# Patient Record
Sex: Male | Born: 1970 | Race: Black or African American | Hispanic: No | Smoking: Current every day smoker
Health system: Southern US, Community
[De-identification: ages and names within clinical notes are randomized; demographics above are authoritative.]

## PROBLEM LIST (undated history)

## (undated) DIAGNOSIS — IMO0001 Reserved for inherently not codable concepts without codable children: Secondary | ICD-10-CM

## (undated) HISTORY — PX: MOUTH SURGERY: SHX715

## (undated) HISTORY — PX: OTHER SURGICAL HISTORY: SHX169

---

## 2012-03-29 ENCOUNTER — Encounter (HOSPITAL_COMMUNITY): Payer: Self-pay | Admitting: Emergency Medicine

## 2012-03-29 ENCOUNTER — Emergency Department (HOSPITAL_COMMUNITY)
Admission: EM | Admit: 2012-03-29 | Discharge: 2012-03-30 | Disposition: A | Discharge status: Home / Self Care | Payer: Self-pay | Attending: Emergency Medicine | Admitting: Emergency Medicine

## 2012-03-29 DIAGNOSIS — F17200 Nicotine dependence, unspecified, uncomplicated: Secondary | ICD-10-CM | POA: Insufficient documentation

## 2012-03-29 DIAGNOSIS — IMO0001 Reserved for inherently not codable concepts without codable children: Secondary | ICD-10-CM | POA: Insufficient documentation

## 2012-03-29 DIAGNOSIS — L089 Local infection of the skin and subcutaneous tissue, unspecified: Secondary | ICD-10-CM

## 2012-03-29 DIAGNOSIS — L723 Sebaceous cyst: Secondary | ICD-10-CM | POA: Insufficient documentation

## 2012-03-29 HISTORY — DX: Reserved for inherently not codable concepts without codable children: IMO0001

## 2012-03-29 NOTE — ED Notes (Signed)
Patient complaining of boil on left side of cheek that started two days ago.  Denies drainage; reports discomfort upon palpation.

## 2012-03-30 MED ORDER — SULFAMETHOXAZOLE-TRIMETHOPRIM 800-160 MG PO TABS: 1.0000 | ORAL_TABLET | Freq: Two times a day (BID) | ORAL | Status: DC

## 2012-03-30 MED ORDER — HYDROCODONE-ACETAMINOPHEN 5-325 MG PO TABS
1.0000 | ORAL_TABLET | Freq: Once | ORAL | Status: AC
  Administered 2012-03-30: 1 via ORAL
  Filled 2012-03-30: qty 1

## 2012-03-30 MED ORDER — HYDROCODONE-ACETAMINOPHEN 5-325 MG PO TABS: 1.0000 | ORAL_TABLET | ORAL | Status: DC | PRN

## 2012-03-30 NOTE — ED Notes (Signed)
Swollen area lt temple.red and angry appearing

## 2012-03-30 NOTE — ED Provider Notes (Signed)
Medical screening examination/treatment/procedure(s) were performed by non-physician practitioner and as supervising physician I was immediately available for consultation/collaboration.    Vida Roller, MD 03/30/12 [phone]

## 2012-03-30 NOTE — Discharge Instructions (Signed)

## 2012-03-30 NOTE — ED Provider Notes (Signed)
History     CSN: [id number]  Arrival date & time 03/29/12  2258   First MD Initiated Contact with Patient 03/30/12 0038      Chief Complaint  Patient presents with  . Abscess    (Consider location/radiation/quality/duration/timing/severity/associated sxs/prior treatment) HPI Comments: Patient here with 2 day history of abscess to left side of face  - states that it has gradually increased in size - states no drainage from the area - denies fever chills, history of similar abscesses.  Patient is a 41 y.o. male presenting with abscess. The history is provided by the patient. No language interpreter was used.  Abscess  This is a new problem. The current episode started less than one week ago. The onset was gradual. The problem occurs rarely. The problem has been unchanged. The abscess is present on the face (left cheek). The problem is severe. The abscess is characterized by redness, painfulness and swelling. The patient was exposed to OTC medications. The abscess first occurred at home. Pertinent negatives include no anorexia, no decrease in physical activity, not sleeping less, not drinking less, no fever, no fussiness, not sleeping more, no diarrhea, no vomiting, no congestion, no rhinorrhea, no sore throat, no decreased responsiveness and no cough. His past medical history does not include skin abscesses in family. There were no sick contacts. He has received no recent medical care.    Past Medical History  Diagnosis Date  . No significant past medical history     Past Surgical History  Procedure Date  . Mouth surgery     History reviewed. No pertinent family history.  History  Substance Use Topics  . Smoking status: Current Everyday Smoker -- 1.0 packs/day  . Smokeless tobacco: Not on file  . Alcohol Use: Yes     Occassional Use      Review of Systems  Constitutional: Negative for fever, chills and decreased responsiveness.  HENT: Negative for congestion, sore throat  and rhinorrhea.   Eyes: Negative for pain.  Respiratory: Negative for cough.   Gastrointestinal: Negative for vomiting, diarrhea and anorexia.  Genitourinary: Negative for flank pain.  Musculoskeletal: Negative for back pain.  Skin: Positive for wound.    Allergies  Review of patient's allergies indicates no known allergies.  Home Medications   Current Outpatient Rx  Name Route Sig Dispense Refill  . HYDROCODONE-ACETAMINOPHEN 5-325 MG PO TABS Oral Take 1 tablet by mouth every 4 (four) hours as needed for pain. 30 tablet 0  . SULFAMETHOXAZOLE-TRIMETHOPRIM 800-160 MG PO TABS Oral Take 1 tablet by mouth 2 (two) times daily. 14 tablet 0    BP 135/81  Pulse 93  Temp(Src) 98.3 F (36.8 C) (Oral)  Resp 18  SpO2 97%  Physical Exam  Nursing note and vitals reviewed. Constitutional: He is oriented to person, place, and time. He appears well-developed and well-nourished. No distress.  HENT:  Head: Atraumatic.    Right Ear: External ear normal.  Left Ear: External ear normal.  Nose: Nose normal.  Mouth/Throat: Oropharynx is clear and moist. No oropharyngeal exudate.       2 cm area of fluctuance and swelling just preauricular, some firmness of the area also noted - mild erythema  Eyes: Conjunctivae are normal. Pupils are equal, round, and reactive to light. No scleral icterus.  Neck: Normal range of motion. Neck supple.  Cardiovascular: Normal rate, regular rhythm and normal heart sounds.  Exam reveals no gallop and no friction rub.   No murmur heard. Pulmonary/Chest: Effort normal  and breath sounds normal. No respiratory distress. He has no wheezes. He has no rales. He exhibits no tenderness.  Abdominal: Soft. Bowel sounds are normal. He exhibits no distension. There is no tenderness.  Musculoskeletal: Normal range of motion. He exhibits no edema and no tenderness.  Lymphadenopathy:    He has no cervical adenopathy.  Neurological: He is alert and oriented to person, place, and  time. No cranial nerve deficit.  Skin: Skin is warm and dry. No rash noted. There is erythema. No pallor.  Psychiatric: He has a normal mood and affect. His behavior is normal. Judgment and thought content normal.    ED Course  Procedures (including critical care time)  Labs Reviewed - No data to display No results found.  INCISION AND DRAINAGE Performed by: Cherrie Distance C. Consent: Verbal consent obtained. Risks and benefits: risks, benefits and alternatives were discussed Type: abscess  Body area: left face  Anesthesia: local infiltration  Local anesthetic: lidocaine 1% with epinephrine  Anesthetic total: 3 ml  Complexity: complex Blunt dissection to break up loculations  Drainage: purulent  Drainage amount: large  Packing material: 1/4 in iodoform gauze  Patient tolerance: Patient tolerated the procedure well with no immediate complications.   1. Infected sebaceous cyst       MDM  Patient with infected sebaceous cyst, able to express the contents of the capsule but unable to completely remove the capsule due to it's anatomic location close to the facial nerve and vascular structures of the face, I have packed the wound and will have the patient return here for follow up in 2 days but likely he will need to be referred further to ENT for this.          Izola Price [place], [place] 03/30/12 [phone]

## 2012-04-01 ENCOUNTER — Emergency Department (HOSPITAL_COMMUNITY)
Admission: EM | Admit: 2012-04-01 | Discharge: 2012-04-01 | Disposition: A | Discharge status: Home / Self Care | Payer: Self-pay | Attending: Emergency Medicine | Admitting: Emergency Medicine

## 2012-04-01 ENCOUNTER — Encounter (HOSPITAL_COMMUNITY): Payer: Self-pay

## 2012-04-01 DIAGNOSIS — L0291 Cutaneous abscess, unspecified: Secondary | ICD-10-CM

## 2012-04-01 DIAGNOSIS — L0201 Cutaneous abscess of face: Secondary | ICD-10-CM | POA: Insufficient documentation

## 2012-04-01 DIAGNOSIS — F17200 Nicotine dependence, unspecified, uncomplicated: Secondary | ICD-10-CM | POA: Insufficient documentation

## 2012-04-01 DIAGNOSIS — L03211 Cellulitis of face: Secondary | ICD-10-CM | POA: Insufficient documentation

## 2012-04-01 DIAGNOSIS — Z09 Encounter for follow-up examination after completed treatment for conditions other than malignant neoplasm: Secondary | ICD-10-CM | POA: Insufficient documentation

## 2012-04-01 NOTE — ED Provider Notes (Signed)
History   This chart was scribed for Benny Lennert, MD by Charolett Bumpers . The patient was seen in room STRE4/STRE4.    CSN: [id number]  Arrival date & time 04/01/12  1205   First MD Initiated Contact with Patient 04/01/12 1223      No chief complaint on file.   (Consider location/radiation/quality/duration/timing/severity/associated sxs/prior treatment) HPI Comments: Patient states that he is here for a re-evaluation of an abscess that was I&D'd 2 days ago, that is located on his left cheek. Patient reports associated drainage to the area and states the packing came out last night. Patient reports compliance with abx.   Patient is a 41 y.o. male presenting with wound check. The history is provided by the patient.  Wound Check  He was treated in the ED 2 to 3 days ago. Previous treatment in the ED includes I&D of abscess. Treatments since wound repair include oral antibiotics. There has been clear discharge from the wound. There is no redness present. There is no swelling present. The pain has improved.   Drained Tuesday morning Patient states that it was   Past Medical History  Diagnosis Date  . No significant past medical history     Past Surgical History  Procedure Date  . Mouth surgery     No family history on file.  History  Substance Use Topics  . Smoking status: Current Everyday Smoker -- 1.0 packs/day  . Smokeless tobacco: Not on file  . Alcohol Use: Yes     Occassional Use      Review of Systems  Constitutional: Negative for fever and chills.  Respiratory: Negative for shortness of breath.   Gastrointestinal: Negative for nausea and vomiting.  Skin: Positive for wound.  Neurological: Negative for weakness.  All other systems reviewed and are negative.    Allergies  Review of patient's allergies indicates no known allergies.  Home Medications   Current Outpatient Rx  Name Route Sig Dispense Refill  . HYDROCODONE-ACETAMINOPHEN 5-325 MG PO  TABS Oral Take 1 tablet by mouth every 4 (four) hours as needed for pain. 30 tablet 0  . SULFAMETHOXAZOLE-TRIMETHOPRIM 800-160 MG PO TABS Oral Take 1 tablet by mouth 2 (two) times daily. 14 tablet 0    BP 105/75  Pulse 78  Temp(Src) 98.4 F (36.9 C) (Oral)  Resp 16  Ht 5\' 11"  (1.803 m)  Wt 142 lb (64.411 kg)  BMI 19.81 kg/m2  SpO2 98%  Physical Exam  Constitutional: He is oriented to person, place, and time. He appears well-developed.  HENT:  Head: Normocephalic.  Eyes: Conjunctivae are normal.  Neck: No tracheal deviation present.  Cardiovascular:  No murmur heard. Musculoskeletal: Normal range of motion.  Neurological: He is oriented to person, place, and time.  Skin: Skin is warm.       Cyst to left cheek that has an incision from an abscess that is healing.   Psychiatric: He has a normal mood and affect.    ED Course  Procedures (including critical care time)  DIAGNOSTIC STUDIES: Oxygen Saturation is 98% on room air, normal by my interpretation.    COORDINATION OF CARE:  1228: Discussed planned course of treatment with the patient, who is agreeable at this time.     Labs Reviewed - No data to display No results found.   No diagnosis found.    MDM  The chart was scribed for me under my direct supervision.  I personally performed the history, physical, and medical decision  making and all procedures in the evaluation of this patient.Benny Lennert, MD 04/01/12 [phone]

## 2012-04-01 NOTE — ED Notes (Signed)
Pt presents for re-evaluation of abscess that was I&D'd on Tuesday morning.  Pt reports compliance with abx.  Pt reports drainage to area, reports packing came out last night.

## 2012-04-01 NOTE — ED Notes (Signed)
Cyst improved by 50% since previous visit this past Tuesday.

## 2012-04-01 NOTE — Discharge Instructions (Signed)
Follow up with dr. Suszanne Conners to have cyst removed

## 2014-01-05 ENCOUNTER — Ambulatory Visit (HOSPITAL_COMMUNITY)
Admission: RE | Admit: 2014-01-05 | Discharge: 2014-01-05 | Disposition: A | Discharge status: Home / Self Care | Payer: Disability Insurance | Source: Ambulatory Visit | Attending: Family Medicine | Admitting: Family Medicine

## 2014-01-05 ENCOUNTER — Other Ambulatory Visit (HOSPITAL_COMMUNITY): Payer: Self-pay | Admitting: Family Medicine

## 2014-01-05 DIAGNOSIS — M25559 Pain in unspecified hip: Secondary | ICD-10-CM | POA: Insufficient documentation

## 2014-01-05 DIAGNOSIS — M25552 Pain in left hip: Secondary | ICD-10-CM

## 2015-04-13 ENCOUNTER — Emergency Department (HOSPITAL_COMMUNITY)
Admission: EM | Admit: 2015-04-13 | Discharge: 2015-04-14 | Disposition: A | Discharge status: Home / Self Care | Payer: Medicare Other | Attending: Emergency Medicine | Admitting: Emergency Medicine

## 2015-04-13 ENCOUNTER — Encounter (HOSPITAL_COMMUNITY): Payer: Self-pay | Admitting: Emergency Medicine

## 2015-04-13 DIAGNOSIS — Z720 Tobacco use: Secondary | ICD-10-CM | POA: Insufficient documentation

## 2015-04-13 DIAGNOSIS — R1032 Left lower quadrant pain: Secondary | ICD-10-CM | POA: Insufficient documentation

## 2015-04-13 DIAGNOSIS — R630 Anorexia: Secondary | ICD-10-CM | POA: Insufficient documentation

## 2015-04-13 DIAGNOSIS — R109 Unspecified abdominal pain: Secondary | ICD-10-CM

## 2015-04-13 DIAGNOSIS — M545 Low back pain: Secondary | ICD-10-CM | POA: Insufficient documentation

## 2015-04-13 LAB — CBC WITH DIFFERENTIAL/PLATELET
BASOS ABS: 0 10*3/uL (ref 0.0–0.1)
BASOS PCT: 0 % (ref 0–1)
EOS PCT: 2 % (ref 0–5)
Eosinophils Absolute: 0.2 10*3/uL (ref 0.0–0.7)
HEMATOCRIT: 42.1 % (ref 39.0–52.0)
HEMOGLOBIN: 14.2 g/dL (ref 13.0–17.0)
Lymphocytes Relative: 39 % (ref 12–46)
Lymphs Abs: 3.8 10*3/uL (ref 0.7–4.0)
MCH: 32.6 pg (ref 26.0–34.0)
MCHC: 33.7 g/dL (ref 30.0–36.0)
MCV: 96.8 fL (ref 78.0–100.0)
MONO ABS: 0.9 10*3/uL (ref 0.1–1.0)
MONOS PCT: 9 % (ref 3–12)
Neutro Abs: 4.8 10*3/uL (ref 1.7–7.7)
Neutrophils Relative %: 50 % (ref 43–77)
Platelets: 265 10*3/uL (ref 150–400)
RBC: 4.35 MIL/uL (ref 4.22–5.81)
RDW: 12 % (ref 11.5–15.5)
WBC: 9.7 10*3/uL (ref 4.0–10.5)

## 2015-04-13 LAB — COMPREHENSIVE METABOLIC PANEL
ALT: 14 U/L — ABNORMAL LOW (ref 17–63)
AST: 26 U/L (ref 15–41)
Albumin: 3.6 g/dL (ref 3.5–5.0)
Alkaline Phosphatase: 63 U/L (ref 38–126)
Anion gap: 10 (ref 5–15)
BUN: <5 mg/dL — ABNORMAL LOW (ref 6–20)
CALCIUM: 8.7 mg/dL — AB (ref 8.9–10.3)
CO2: 20 mmol/L — AB (ref 22–32)
CREATININE: 1.03 mg/dL (ref 0.61–1.24)
Chloride: 107 mmol/L (ref 101–111)
GLUCOSE: 72 mg/dL (ref 65–99)
Potassium: 4 mmol/L (ref 3.5–5.1)
Sodium: 137 mmol/L (ref 135–145)
Total Bilirubin: 0.4 mg/dL (ref 0.3–1.2)
Total Protein: 7.4 g/dL (ref 6.5–8.1)

## 2015-04-13 LAB — URINALYSIS, ROUTINE W REFLEX MICROSCOPIC
Bilirubin Urine: NEGATIVE
Glucose, UA: NEGATIVE mg/dL
Hgb urine dipstick: NEGATIVE
KETONES UR: NEGATIVE mg/dL
LEUKOCYTES UA: NEGATIVE
NITRITE: NEGATIVE
PH: 5 (ref 5.0–8.0)
PROTEIN: NEGATIVE mg/dL
Specific Gravity, Urine: 1.004 — ABNORMAL LOW (ref 1.005–1.030)
UROBILINOGEN UA: 0.2 mg/dL (ref 0.0–1.0)

## 2015-04-13 NOTE — ED Notes (Signed)
Pt. woke up this morning with right lower back pain and dysuria , denies hematuria , no fever or chills.

## 2015-04-14 ENCOUNTER — Emergency Department (HOSPITAL_COMMUNITY): Payer: Medicare Other

## 2015-04-14 MED ORDER — MORPHINE SULFATE 4 MG/ML IJ SOLN
6.0000 mg | Freq: Once | INTRAMUSCULAR | Status: AC
  Administered 2015-04-14: 6 mg via INTRAVENOUS
  Filled 2015-04-14: qty 2

## 2015-04-14 MED ORDER — NAPROXEN 500 MG PO TABS: 500.0000 mg | ORAL_TABLET | Freq: Two times a day (BID) | ORAL | Status: AC

## 2015-04-14 MED ORDER — NAPROXEN 250 MG PO TABS
500.0000 mg | ORAL_TABLET | Freq: Once | ORAL | Status: AC
  Administered 2015-04-14: 500 mg via ORAL
  Filled 2015-04-14: qty 2

## 2015-04-14 MED ORDER — CYCLOBENZAPRINE HCL 10 MG PO TABS: 10.0000 mg | ORAL_TABLET | Freq: Two times a day (BID) | ORAL | Status: AC | PRN

## 2015-04-14 MED ORDER — KETOROLAC TROMETHAMINE 15 MG/ML IJ SOLN
15.0000 mg | Freq: Once | INTRAMUSCULAR | Status: AC
  Administered 2015-04-14: 15 mg via INTRAVENOUS
  Filled 2015-04-14: qty 1

## 2015-04-14 MED ORDER — CYCLOBENZAPRINE HCL 10 MG PO TABS
5.0000 mg | ORAL_TABLET | Freq: Once | ORAL | Status: AC
  Administered 2015-04-14: 5 mg via ORAL
  Filled 2015-04-14: qty 1

## 2015-04-14 NOTE — Discharge Instructions (Signed)
If you were given medicines take as directed.  If you are on coumadin or contraceptives realize their levels and effectiveness is altered by many different medicines.  If you have any reaction (rash, tongues swelling, other) to the medicines stop taking and see a physician.    If your blood pressure was elevated in the ER make sure you follow up for management with a primary doctor or return for chest pain, shortness of breath or stroke symptoms.  Please follow up as directed and return to the ER or see a physician for new or worsening symptoms.  Thank you. Filed Vitals:   04/13/15 2313 04/13/15 2314 04/14/15 0015 04/14/15 0045  BP: 129/84  122/96 115/87  Pulse:  90 41 68  Temp:      TempSrc:      Resp:   20 11  SpO2:  94% 94%
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
## 2015-04-14 NOTE — ED Notes (Signed)
Pt states he was told by Dr. Jodi Mourning that he would get a work note for 2 days but note has him going back to work on Sunday.  Spoke with [place], PA and work note given to pt to return to work on Monday.

## 2018-06-14 ENCOUNTER — Encounter: Payer: Self-pay | Admitting: Emergency Medicine

## 2018-06-14 ENCOUNTER — Emergency Department
Admission: EM | Admit: 2018-06-14 | Discharge: 2018-06-14 | Discharge status: Against Medical Advice | Payer: Self-pay | Attending: Emergency Medicine | Admitting: Emergency Medicine

## 2018-06-14 DIAGNOSIS — F32A Depression, unspecified: Secondary | ICD-10-CM

## 2018-06-14 DIAGNOSIS — Z5321 Procedure and treatment not carried out due to patient leaving prior to being seen by health care provider: Secondary | ICD-10-CM | POA: Insufficient documentation

## 2018-06-14 DIAGNOSIS — F17210 Nicotine dependence, cigarettes, uncomplicated: Secondary | ICD-10-CM | POA: Insufficient documentation

## 2018-06-14 DIAGNOSIS — F329 Major depressive disorder, single episode, unspecified: Secondary | ICD-10-CM | POA: Insufficient documentation

## 2018-06-14 DIAGNOSIS — R45851 Suicidal ideations: Secondary | ICD-10-CM | POA: Insufficient documentation

## 2018-06-14 NOTE — ED Triage Notes (Signed)
Arrives via EMS for c/o SI,

## 2018-06-14 NOTE — ED Notes (Signed)
While attempting to dress the patient out he expressed that he just wanted to leave and not be seen. I spoke to Dr. Cyril LoosenKinner and advised him of same. He said he was not going to place the patient under an IVC and he cold leave if he wanted to. I advised the triage and first nurse of same.
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
## 2018-06-14 NOTE — ED Notes (Signed)
Went in to rm to draw blood from pt and pt kept stating "I don't want to stay, I want to leave." Pt was talking with this tech about abscess on left side of his face and saying "I know if I stay here and talk to someone that yall are going to keep me." Made pt aware that Keith,EDT went to talk to MD and he would return. Pt calm while talking.
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
## 2021-02-03 ENCOUNTER — Other Ambulatory Visit: Payer: Self-pay

## 2021-02-03 ENCOUNTER — Emergency Department
Admission: EM | Admit: 2021-02-03 | Discharge: 2021-02-04 | Disposition: A | Discharge status: Home / Self Care | Payer: Self-pay | Attending: Emergency Medicine | Admitting: Emergency Medicine

## 2021-02-03 ENCOUNTER — Emergency Department: Payer: Self-pay

## 2021-02-03 DIAGNOSIS — R202 Paresthesia of skin: Secondary | ICD-10-CM | POA: Insufficient documentation

## 2021-02-03 DIAGNOSIS — F17200 Nicotine dependence, unspecified, uncomplicated: Secondary | ICD-10-CM | POA: Insufficient documentation

## 2021-02-03 DIAGNOSIS — Z79899 Other long term (current) drug therapy: Secondary | ICD-10-CM | POA: Insufficient documentation

## 2021-02-03 DIAGNOSIS — I10 Essential (primary) hypertension: Secondary | ICD-10-CM | POA: Insufficient documentation

## 2021-02-03 NOTE — ED Triage Notes (Signed)
Pt arrives via EMS from work. EMS called earlier around 2130 for pt feeling "shakey" - blood sugar was 72, he ate some food and refused transport to hospital. Called back this time for patient "just still not feeling right." Pt states he just feels off and lightheaded with change in position. Found to be hypertensive. No known medical history or meds but pt admits he does not follow with a doctor regularly. Denies HA, CP, abd pain or n/v/d.
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
## 2021-02-04 ENCOUNTER — Emergency Department: Payer: Self-pay

## 2021-02-04 LAB — URINALYSIS, COMPLETE (UACMP) WITH MICROSCOPIC
Bacteria, UA: NONE SEEN
Bilirubin Urine: NEGATIVE
Glucose, UA: NEGATIVE mg/dL
Ketones, ur: NEGATIVE mg/dL
Leukocytes,Ua: NEGATIVE
Nitrite: NEGATIVE
Protein, ur: NEGATIVE mg/dL
Specific Gravity, Urine: 1.002 — ABNORMAL LOW (ref 1.005–1.030)
pH: 6 (ref 5.0–8.0)

## 2021-02-04 LAB — COMPREHENSIVE METABOLIC PANEL
ALT: 10 U/L (ref 0–44)
AST: 24 U/L (ref 15–41)
Albumin: 3.8 g/dL (ref 3.5–5.0)
Alkaline Phosphatase: 57 U/L (ref 38–126)
Anion gap: 8 (ref 5–15)
BUN: 8 mg/dL (ref 6–20)
CO2: 25 mmol/L (ref 22–32)
Calcium: 8.4 mg/dL — ABNORMAL LOW (ref 8.9–10.3)
Chloride: 104 mmol/L (ref 98–111)
Creatinine, Ser: 0.85 mg/dL (ref 0.61–1.24)
GFR, Estimated: >60 mL/min (ref >60)
Glucose, Bld: 106 mg/dL — ABNORMAL HIGH (ref 70–99)
Potassium: 3.7 mmol/L (ref 3.5–5.1)
Sodium: 137 mmol/L (ref 135–145)
Total Bilirubin: 0.6 mg/dL (ref 0.3–1.2)
Total Protein: 7.7 g/dL (ref 6.5–8.1)

## 2021-02-04 LAB — CBC WITH DIFFERENTIAL/PLATELET
Abs Immature Granulocytes: 0.04 10*3/uL (ref 0.00–0.07)
Basophils Absolute: 0 10*3/uL (ref 0.0–0.1)
Basophils Relative: 0 %
Eosinophils Absolute: 0.1 10*3/uL (ref 0.0–0.5)
Eosinophils Relative: 1 %
HCT: 38.5 % — ABNORMAL LOW (ref 39.0–52.0)
Hemoglobin: 12.6 g/dL — ABNORMAL LOW (ref 13.0–17.0)
Immature Granulocytes: 1 %
Lymphocytes Relative: 13 %
Lymphs Abs: 1.1 10*3/uL (ref 0.7–4.0)
MCH: 31.7 pg (ref 26.0–34.0)
MCHC: 32.7 g/dL (ref 30.0–36.0)
MCV: 97 fL (ref 80.0–100.0)
Monocytes Absolute: 0.7 10*3/uL (ref 0.1–1.0)
Monocytes Relative: 9 %
Neutro Abs: 6.2 10*3/uL (ref 1.7–7.7)
Neutrophils Relative %: 76 %
Platelets: 203 10*3/uL (ref 150–400)
RBC: 3.97 MIL/uL — ABNORMAL LOW (ref 4.22–5.81)
RDW: 11.9 % (ref 11.5–15.5)
WBC: 8.1 10*3/uL (ref 4.0–10.5)
nRBC: 0.4 % — ABNORMAL HIGH (ref 0.0–0.2)

## 2021-02-04 LAB — URINE DRUG SCREEN, QUALITATIVE (ARMC ONLY)
Amphetamines, Ur Screen: NOT DETECTED
Barbiturates, Ur Screen: NOT DETECTED
Benzodiazepine, Ur Scrn: NOT DETECTED
Cannabinoid 50 Ng, Ur ~~LOC~~: POSITIVE — AB
Cocaine Metabolite,Ur ~~LOC~~: NOT DETECTED
MDMA (Ecstasy)Ur Screen: NOT DETECTED
Methadone Scn, Ur: NOT DETECTED
Opiate, Ur Screen: NOT DETECTED
Phencyclidine (PCP) Ur S: NOT DETECTED
Tricyclic, Ur Screen: NOT DETECTED

## 2021-02-04 LAB — TROPONIN I (HIGH SENSITIVITY)
Troponin I (High Sensitivity): 4 ng/L (ref <18)
Troponin I (High Sensitivity): 3 ng/L (ref <18)

## 2021-02-04 MED ORDER — AMLODIPINE BESYLATE 5 MG PO TABS: 5.0000 mg | ORAL_TABLET | Freq: Every day | ORAL | 0 refills | Status: DC

## 2021-02-04 MED ORDER — CLONIDINE HCL 0.1 MG PO TABS
0.1000 mg | ORAL_TABLET | Freq: Once | ORAL | Status: AC
  Administered 2021-02-04: 0.1 mg via ORAL
  Filled 2021-02-04: qty 1

## 2021-02-04 NOTE — Discharge Instructions (Signed)
1.  Start Amlodipine 5mg  daily for your blood pressure. 2.  Keep a log of your blood pressures morning, noon and night.  Take these to your doctor when you follow-up. 3.  Return to the ER for worsening symptoms, persistent vomiting, difficulty breathing or other concerns.

## 2021-02-04 NOTE — ED Notes (Signed)
Diastolic >100 - will administer clonidine per doctor.

## 2021-02-04 NOTE — ED Notes (Signed)
BP Improved, discussed with Doctor and will hold dose of ordered BP med and continue to monitor patient closely. Pt admits to feeling improved and lightheadedness resolved. Resting comfortably in bed. No requests at this time

## 2021-12-02 IMAGING — CT CT HEAD W/O CM
3 series · 16 of 47 positions shown, 19 images · non-contrast
Comparison: None.

CLINICAL DATA: Dizziness

EXAM:
CT HEAD WITHOUT CONTRAST
TECHNIQUE: Contiguous axial images were obtained from the base of the skull
through the vertex without intravenous contrast.

[Series 2: head wo · axial · 0.42mm/px · z∈[+350,+485]mm · 10 of 33 slices shown, 13 images]
[im 3/33  brain]
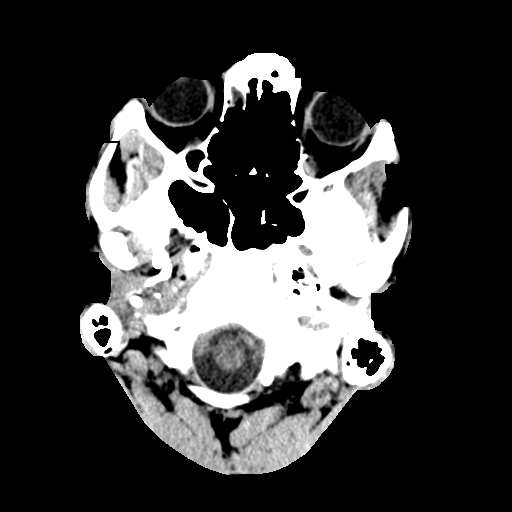
[im 3/33  bone]
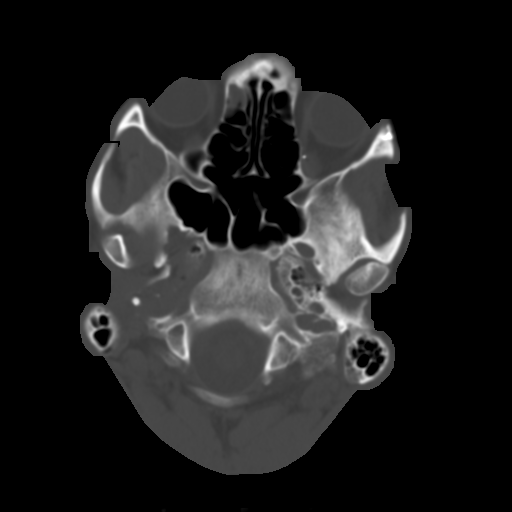
[im 6/33  brain]
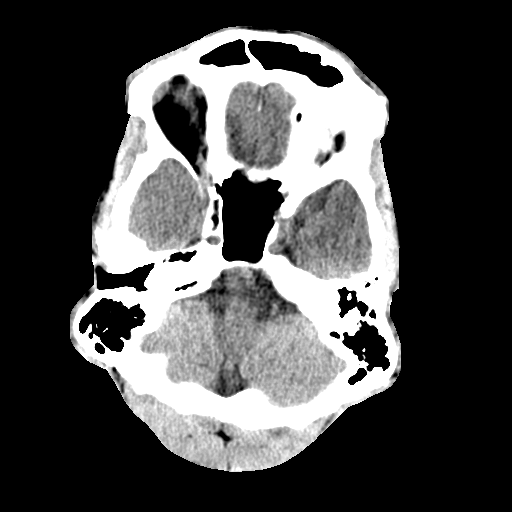
[im 9/33  brain]
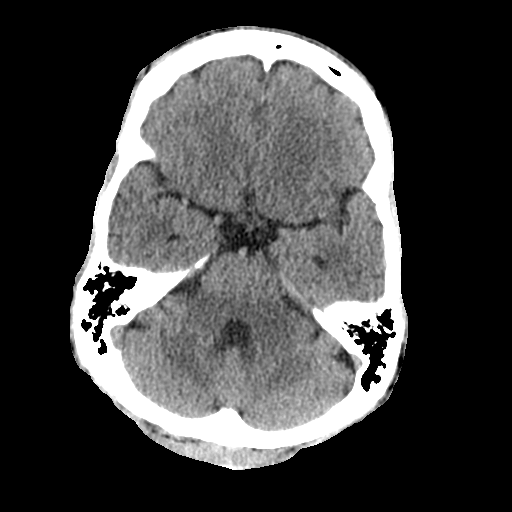
[im 12/33  brain]
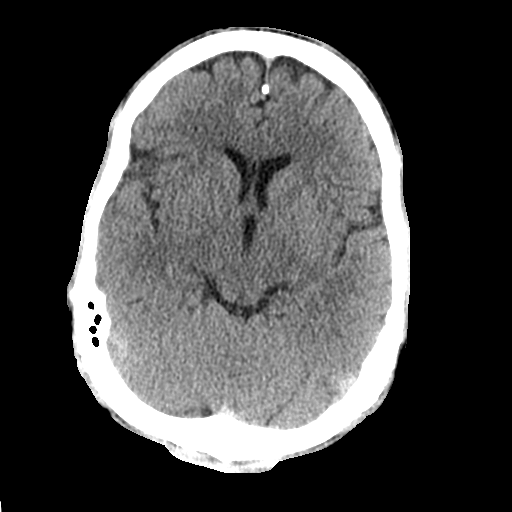
[im 15/33  brain]
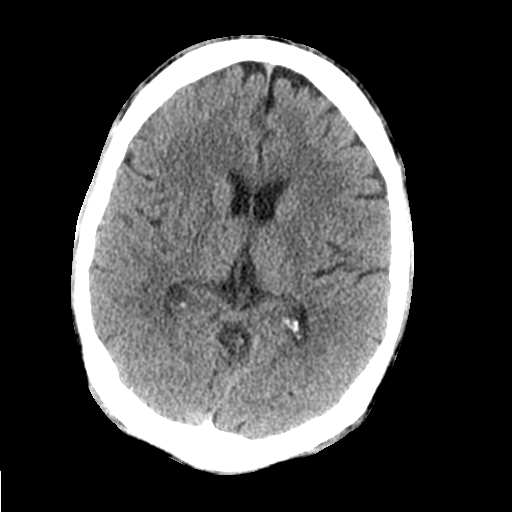
[im 15/33  bone]
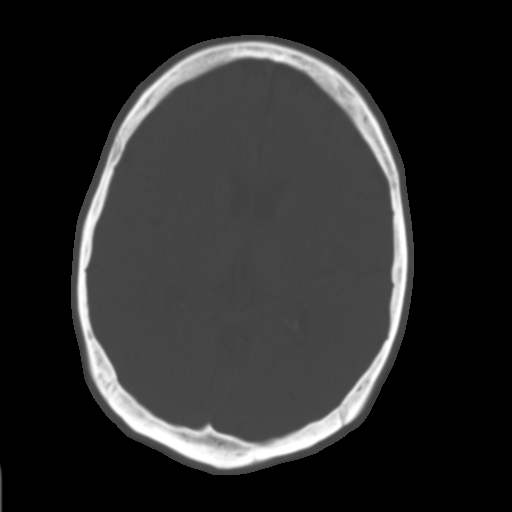
[im 18/33  brain]
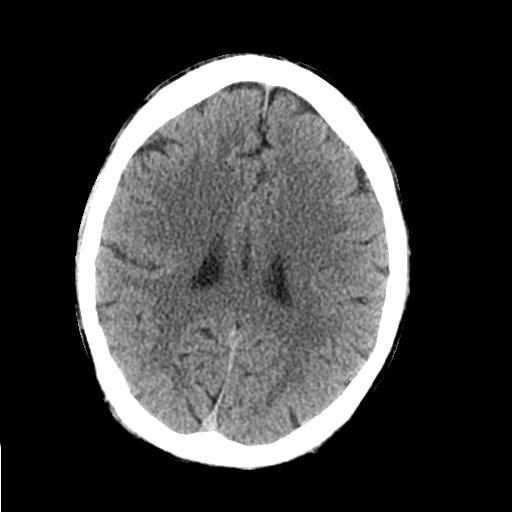
[im 21/33  brain]
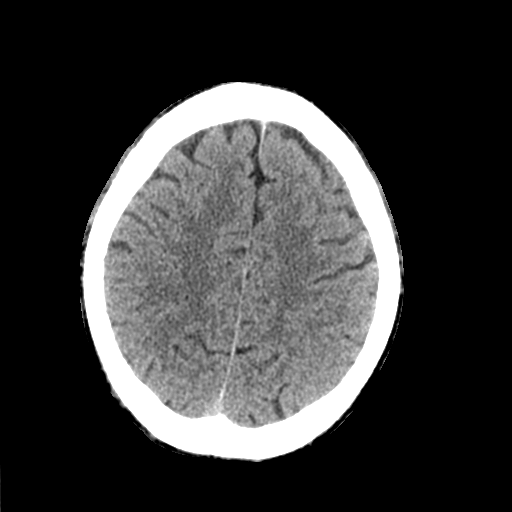
[im 25/33  brain]
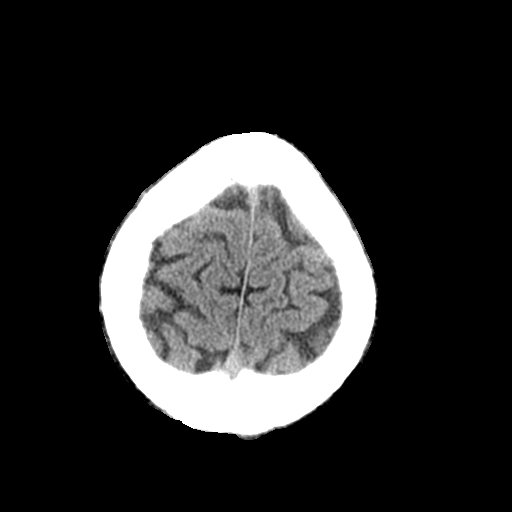
[im 27/33  brain]
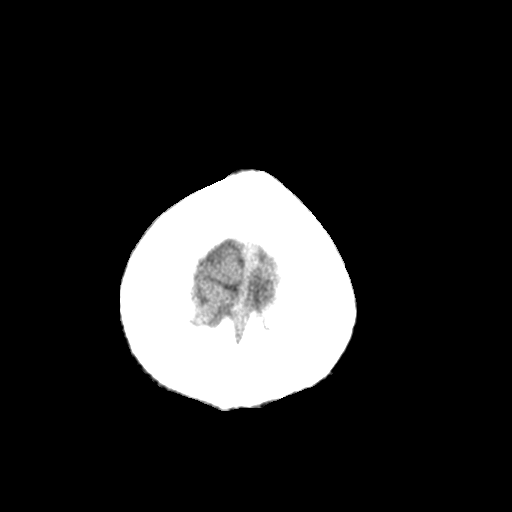
[im 27/33  bone]
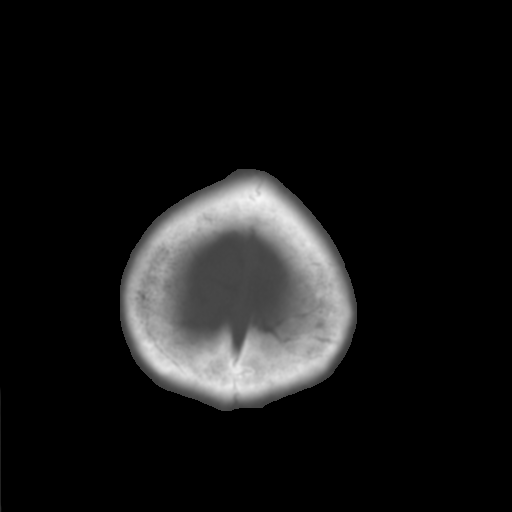
[im 30/33  brain]
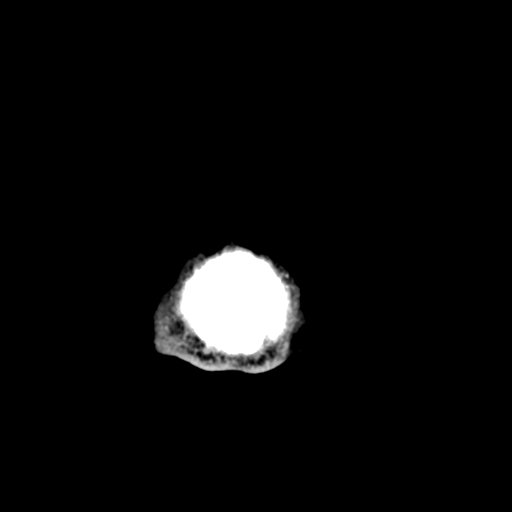

[Series 4: coronal soft tissue · coronal · 0.34mm/px · 3 of 71 slices shown]
[im 24/71  brain]
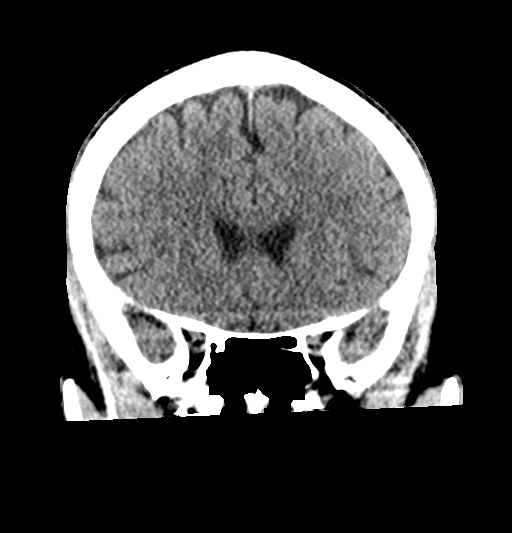
[im 32/71  brain]
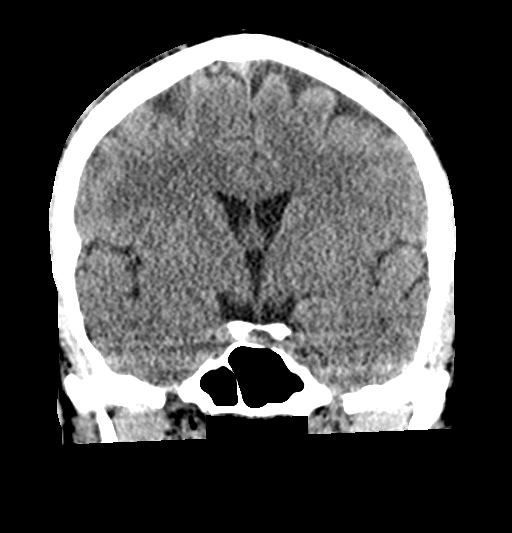
[im 39/71  brain]
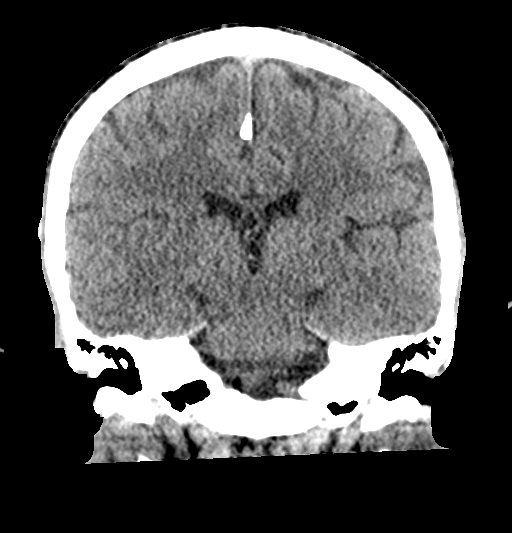

[Series 5: sagittal soft tissue · sagittal · 0.35mm/px · 3 of 58 slices shown]
[im 20/58  brain]
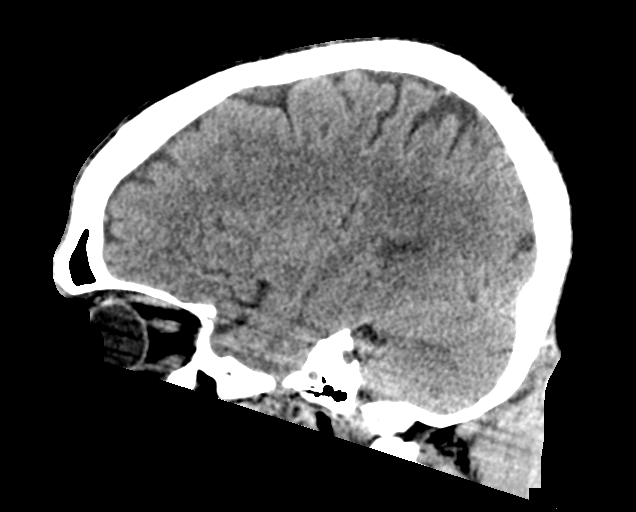
[im 29/58  brain]
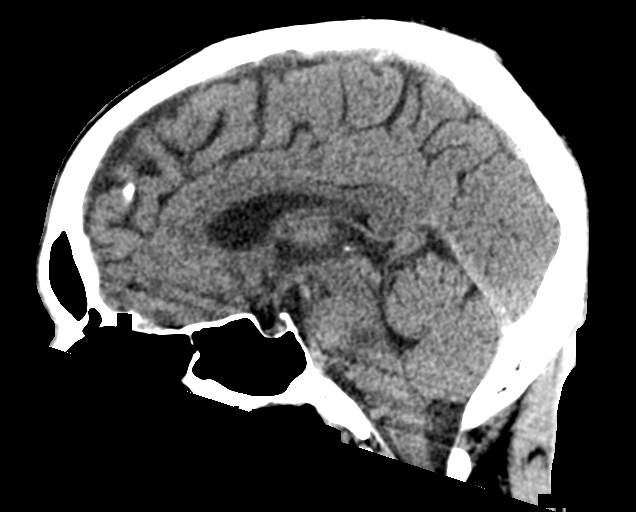
[im 39/58  brain]
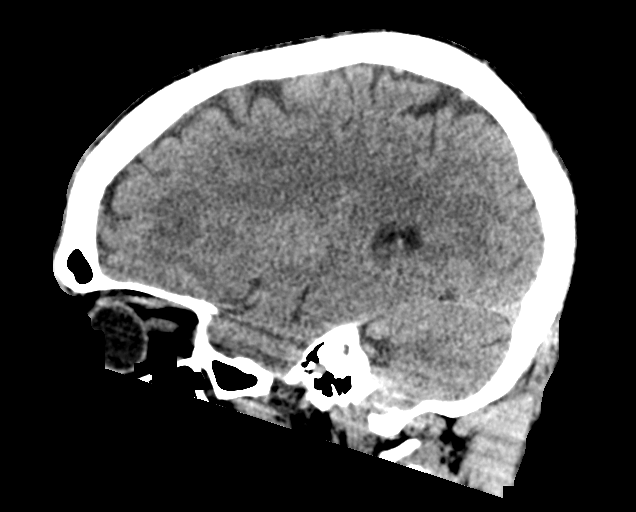

[16 of 47 positions shown; findings below may reference images not displayed]

FINDINGS: Brain: No acute territorial infarction, hemorrhage or intracranial
mass. The ventricles are of normal size.

Vascular: No hyperdense vessels.  No unexpected calcification

Skull: Normal. Negative for fracture or focal lesion.

Sinuses/Orbits: Surgically fixated left superolateral orbit
fracture. Punctate metallic fragments within the orbit. Incompletely
visualized probable hardware at the floor of left orbit. Patchy
mucosal thickening in the ethmoid and right frontal sinus

Other: None
IMPRESSION: 1. No CT evidence for acute intracranial abnormality.
2. Old fracture deformities of left orbit, incompletely visualized
with postsurgical changes

## 2023-10-31 ENCOUNTER — Emergency Department (HOSPITAL_COMMUNITY)
Admission: EM | Admit: 2023-10-31 | Discharge: 2023-10-31 | Disposition: A | Discharge status: Home / Self Care | Payer: Commercial Managed Care - PPO | Attending: Emergency Medicine | Admitting: Emergency Medicine

## 2023-10-31 ENCOUNTER — Encounter (HOSPITAL_COMMUNITY): Payer: Self-pay | Admitting: *Deleted

## 2023-10-31 ENCOUNTER — Other Ambulatory Visit: Payer: Self-pay

## 2023-10-31 DIAGNOSIS — R319 Hematuria, unspecified: Secondary | ICD-10-CM | POA: Diagnosis present

## 2023-10-31 LAB — CBC WITH DIFFERENTIAL/PLATELET
Abs Immature Granulocytes: 0.01 10*3/uL (ref 0.00–0.07)
Basophils Absolute: 0 10*3/uL (ref 0.0–0.1)
Basophils Relative: 1 %
Eosinophils Absolute: 0.1 10*3/uL (ref 0.0–0.5)
Eosinophils Relative: 2 %
HCT: 41.1 % (ref 39.0–52.0)
Hemoglobin: 13.6 g/dL (ref 13.0–17.0)
Immature Granulocytes: 0 %
Lymphocytes Relative: 52 %
Lymphs Abs: 2.8 10*3/uL (ref 0.7–4.0)
MCH: 32.2 pg (ref 26.0–34.0)
MCHC: 33.1 g/dL (ref 30.0–36.0)
MCV: 97.4 fL (ref 80.0–100.0)
Monocytes Absolute: 0.4 10*3/uL (ref 0.1–1.0)
Monocytes Relative: 8 %
Neutro Abs: 2 10*3/uL (ref 1.7–7.7)
Neutrophils Relative %: 37 %
Platelets: 265 10*3/uL (ref 150–400)
RBC: 4.22 MIL/uL (ref 4.22–5.81)
RDW: 12.5 % (ref 11.5–15.5)
WBC: 5.4 10*3/uL (ref 4.0–10.5)
nRBC: 0 % (ref 0.0–0.2)

## 2023-10-31 LAB — BASIC METABOLIC PANEL
Anion gap: 10 (ref 5–15)
BUN: 6 mg/dL (ref 6–20)
CO2: 23 mmol/L (ref 22–32)
Calcium: 8.6 mg/dL — ABNORMAL LOW (ref 8.9–10.3)
Chloride: 105 mmol/L (ref 98–111)
Creatinine, Ser: 0.88 mg/dL (ref 0.61–1.24)
GFR, Estimated: >60 mL/min (ref >60)
Glucose, Bld: 110 mg/dL — ABNORMAL HIGH (ref 70–99)
Potassium: 3.8 mmol/L (ref 3.5–5.1)
Sodium: 138 mmol/L (ref 135–145)

## 2023-10-31 LAB — URINALYSIS, ROUTINE W REFLEX MICROSCOPIC
Bacteria, UA: NONE SEEN
Bilirubin Urine: NEGATIVE
Glucose, UA: NEGATIVE mg/dL
Ketones, ur: NEGATIVE mg/dL
Leukocytes,Ua: NEGATIVE
Nitrite: NEGATIVE
Protein, ur: NEGATIVE mg/dL
Specific Gravity, Urine: 1.002 — ABNORMAL LOW (ref 1.005–1.030)
pH: 5 (ref 5.0–8.0)

## 2023-10-31 NOTE — ED Triage Notes (Signed)
 Pt with blood urine for past 3 days. Denies hx kidney stones, denies any burning with urination.

## 2023-10-31 NOTE — ED Provider Notes (Signed)
 [place] EMERGENCY DEPARTMENT AT [facility] Provider Note   CSN: [id number] Arrival date & time: 10/31/23  1737     History  Chief Complaint  Patient presents with   Hematuria    Mackay Hanauer is a 53 y.o. male.  He has no significant PMH.  He presents to the ER with his wife today for evaluation of blood in the urine, has been intermittent x 3 days, he states has had this happen in the past however today after having a bowel movement he urinated and stated it looked like straight blood.  He states it was an amount that was worrisome to him and made him decide to come to the ER, no flank pain or back pain, no nausea or vomiting or abdominal pain.  Reports family history of prostate cancer.  He denies dysuria or urethral discharge   Hematuria       Home Medications Prior to Admission medications   Medication Sig Start Date End Date Taking? Authorizing Provider  amLODipine  (NORVASC ) 5 MG tablet Take 1 tablet (5 mg total) by mouth daily. 02/04/21   Sung, Jade J, MD  cyclobenzaprine  (FLEXERIL ) 10 MG tablet Take 1 tablet (10 mg total) by mouth 2 (two) times daily as needed for muscle spasms. Patient not taking: Reported on 02/04/2021 04/14/15   Tonia Chew, MD  naproxen  (NAPROSYN ) 500 MG tablet Take 1 tablet (500 mg total) by mouth 2 (two) times daily. Patient not taking: Reported on 02/04/2021 04/14/15   Tonia Chew, MD      Allergies    Patient has no known allergies.    Review of Systems   Review of Systems  Genitourinary:  Positive for hematuria.    Physical Exam Updated Vital Signs BP (!) 144/116 (BP Location: Right Arm)   Pulse 85   Temp 97.6 F (36.4 C)   Resp 19   Ht 5' 11 (1.803 m)   Wt 68 kg   SpO2 97%   BMI 20.92 kg/m  Physical Exam Vitals and nursing note reviewed.  Constitutional:      General: He is not in acute distress.    Appearance: He is well-developed.  HENT:     Head: Normocephalic and atraumatic.     Mouth/Throat:      Mouth: Mucous membranes are moist.  Eyes:     Extraocular Movements: Extraocular movements intact.     Conjunctiva/sclera: Conjunctivae normal.     Pupils: Pupils are equal, round, and reactive to light.  Cardiovascular:     Rate and Rhythm: Normal rate and regular rhythm.     Heart sounds: No murmur heard. Pulmonary:     Effort: Pulmonary effort is normal. No respiratory distress.     Breath sounds: Normal breath sounds.  Abdominal:     Palpations: Abdomen is soft.     Tenderness: There is no abdominal tenderness. There is no right CVA tenderness.  Musculoskeletal:        General: No swelling.     Cervical back: Neck supple.  Skin:    General: Skin is warm and dry.     Capillary Refill: Capillary refill takes less than 2 seconds.  Neurological:     General: No focal deficit present.     Mental Status: He is alert and oriented to person, place, and time.  Psychiatric:        Mood and Affect: Mood normal.     ED Results / Procedures / Treatments   Labs (all labs ordered  are listed, but only abnormal results are displayed) Labs Reviewed  URINALYSIS, ROUTINE W REFLEX MICROSCOPIC - Abnormal; Notable for the following components:      Result Value   Color, Urine STRAW (*)    Specific Gravity, Urine 1.002 (*)    Hgb urine dipstick SMALL (*)    All other components within normal limits  BASIC METABOLIC PANEL - Abnormal; Notable for the following components:   Glucose, Bld 110 (*)    Calcium 8.6 (*)    All other components within normal limits  CBC WITH DIFFERENTIAL/PLATELET    EKG None  Radiology No results found.  Procedures Procedures    Medications Ordered in ED Medications - No data to display  ED Course/ Medical Decision Making/ A&P                                 Medical Decision Making This patient presents to the ED for concern of hematuria, this involves an extensive number of treatment options, and is a complaint that carries with it a high risk of  complications and morbidity.  The differential diagnosis includes UTI, ureterolithiasis, malignancy, other   Co morbidities that complicate the patient evaluation  Tobacco abuse, cannabis use   Additional history obtained:  Additional history obtained from EMR External records from outside source obtained and reviewed including prior notes and labs   Lab Tests:  I Ordered, and personally interpreted labs.  The pertinent results include: Urinalysis shows no hematuria, no UTI, CBC is normal, BMP with slightly low calcium but otherwise normal     Problem List / ED Course / Critical interventions / Medication management  Hematuria-patient not having hematuria consistently, no blood in his urine today but states he saw blood in his urine this morning.  He is a smoker, discussed that this can increase the risk for cancer screening specifically bladder cancer and urged him to follow-up closely with urologist as an outpatient.  Incidentally noted elevated blood pressure today.  Patient states not on any meds currently, advised to follow-up with PCP for recheck and given resources for PCP to establish care.  He is given strict return precautions.  Has a reassuring physical exam and is well-appearing  I have reviewed the patients home medicines and have made adjustments as needed      Amount and/or Complexity of Data Reviewed Labs: ordered.           Final Clinical Impression(s) / ED Diagnoses Final diagnoses:  Hematuria, unspecified type    Rx / DC Orders ED Discharge Orders     None         Suellen Sherran DELENA DEVONNA 10/31/23 2145    Francesca Elsie CROME, MD 10/31/23 2241

## 2023-10-31 NOTE — Discharge Instructions (Addendum)
 It was a pleasure taking care of you.  You were seen in the ER for blood in your urine.  Fortunately there is no sign of infection and currently no blood in your urine today.  Your  kidney function was normal and blood counts were normal as well.  Since you have been having on and off blood in your urine at home for a while I recommend you follow-up with the urologist.  You should also follow-up with a primary care doctor, as blood pressure was slightly elevated today in the ER.  [facility] Primary Care Doctor List    Rollene Pesa, MD. Specialty: [facility] Medicine Contact information: [address]  [place] [address]  [phone]   Glendia Fielding, MD. Specialty: [facility] Medicine Contact information: [address]  [place] [address]  [phone]   Benita Outhouse, MD Specialty: Internal Medicine Contact information: [address] [place] [address]  [phone]   Darlyn Hurst, MD. Specialty: Internal Medicine Contact information: [address]  [place] [address]  [phone]    [facility] (Dr. Luke) Specialty: Family Medicine Contact information: [address]  [place] [address]  [phone]   Garnette Lolling, MD. Specialty: [facility] Medicine Contact information: [address]  [address]  [place] [address]  [phone]   Gaither Langton, MD. Specialty: Internal Medicine Contact information: [address]  [address]  [place] [address]  [phone]    [facility]  [address] [place], [place] [address] [phone]  Services [facility] offers a variety of basic health services.  Services include but are not limited to: Blood pressure checks  Heart rate checks  Blood sugar checks  Urine analysis  Rapid strep tests  Pregnancy tests.  Health education and referrals  People needing more complex services will be  directed to a physician online. Using these virtual visits, doctors can evaluate and prescribe medicine and treatments. There will be no medication on-site, though [facility] will help patients fill their prescriptions at little to no cost.   For More information please go to: [email or web address]

## 2024-08-29 ENCOUNTER — Emergency Department
Admission: EM | Admit: 2024-08-29 | Discharge: 2024-08-29 | Disposition: A | Discharge status: Home / Self Care | Attending: Emergency Medicine | Admitting: Emergency Medicine

## 2024-08-29 ENCOUNTER — Other Ambulatory Visit: Payer: Self-pay

## 2024-08-29 DIAGNOSIS — I10 Essential (primary) hypertension: Secondary | ICD-10-CM | POA: Insufficient documentation

## 2024-08-29 DIAGNOSIS — Z79899 Other long term (current) drug therapy: Secondary | ICD-10-CM | POA: Insufficient documentation

## 2024-08-29 LAB — BASIC METABOLIC PANEL WITH GFR
Anion gap: 16 — ABNORMAL HIGH (ref 5–15)
BUN: 9 mg/dL (ref 6–20)
CO2: 21 mmol/L — ABNORMAL LOW (ref 22–32)
Calcium: 9.2 mg/dL (ref 8.9–10.3)
Chloride: 101 mmol/L (ref 98–111)
Creatinine, Ser: 1.06 mg/dL (ref 0.61–1.24)
GFR, Estimated: >60 mL/min (ref >60)
Glucose, Bld: 71 mg/dL (ref 70–99)
Potassium: 4.5 mmol/L (ref 3.5–5.1)
Sodium: 138 mmol/L (ref 135–145)

## 2024-08-29 LAB — CBC
HCT: 40.2 % (ref 39.0–52.0)
Hemoglobin: 13.4 g/dL (ref 13.0–17.0)
MCH: 31.7 pg (ref 26.0–34.0)
MCHC: 33.3 g/dL (ref 30.0–36.0)
MCV: 95 fL (ref 80.0–100.0)
Platelets: 245 K/uL (ref 150–400)
RBC: 4.23 MIL/uL (ref 4.22–5.81)
RDW: 12.2 % (ref 11.5–15.5)
WBC: 7.6 K/uL (ref 4.0–10.5)
nRBC: 0 % (ref 0.0–0.2)

## 2024-08-29 MED ORDER — AMLODIPINE BESYLATE 5 MG PO TABS: 10.0000 mg | ORAL_TABLET | Freq: Every day | ORAL | 0 refills | Status: DC

## 2024-08-29 MED ORDER — CLONIDINE HCL 0.1 MG PO TABS
0.1000 mg | ORAL_TABLET | Freq: Once | ORAL | Status: AC
  Administered 2024-08-29: 0.1 mg via ORAL
  Filled 2024-08-29: qty 1

## 2024-08-29 MED ORDER — AMLODIPINE BESYLATE 5 MG PO TABS: 5.0000 mg | ORAL_TABLET | Freq: Every day | ORAL | 11 refills | Status: AC

## 2024-08-29 MED ORDER — AMLODIPINE BESYLATE 5 MG PO TABS: 10.0000 mg | ORAL_TABLET | Freq: Every day | ORAL | 2 refills | Status: AC

## 2024-08-29 NOTE — ED Triage Notes (Signed)
 Patient arrives from work with Daniel Palmer after complaints of generally not feeling too well. The patient was found to be hypertensive at work which prompted a call to EMS. Last pressure with EMS was 177/106. Patient denies headaches or visual disturbances. Patient reports not being compliant with home BP medications. Patient denies pain, but does endorse some diarrhea with no other symptoms associated.

## 2024-08-29 NOTE — ED Provider Notes (Signed)
 [facility] Provider Note    Event Date/Time   First MD Initiated Contact with Patient 08/29/24 1135     (approximate)   History   Hypertension   HPI  Daniel Palmer is a 53 y.o. male with a history of high blood pressure who reportedly takes amlodipine  but has not seen a PCP in 3 years since being prescribed amlodipine  per review of records.  He reports that he felt off today while at work.  When they checked his blood pressure they found to be high.  He denies chest pain, denies headache, no neurodeficits.  No shortness of breath.  No abdominal pain.  Does report that he had diarrhea last night but that seems to have resolved.  Feels quite well at this time.  No dizziness     Physical Exam   Triage Vital Signs: ED Triage Vitals  Encounter Vitals Group     BP 08/29/24 1138 (!) 162/126     Girls Systolic BP Percentile --      Girls Diastolic BP Percentile --      Boys Systolic BP Percentile --      Boys Diastolic BP Percentile --      Pulse Rate 08/29/24 1138 100     Resp 08/29/24 1138 18     Temp 08/29/24 1138 98.3 F (36.8 C)     Temp Source 08/29/24 1138 Oral     SpO2 08/29/24 1138 100 %     Weight 08/29/24 1130 68.9 kg (152 lb)     Height 08/29/24 1130 1.803 m (5' 11)     Head Circumference --      Peak Flow --      Pain Score 08/29/24 1138 0     Pain Loc --      Pain Education --      Exclude from Growth Chart --     Most recent vital signs: Vitals:   08/29/24 1138 08/29/24 1218  BP: (!) 162/126 (!) 162/126  Pulse: 100   Resp: 18   Temp: 98.3 F (36.8 C)   SpO2: 100%      General: Awake, no distress.  CV:  Good peripheral perfusion.  Regular rate and rhythm clear to auscultation Resp:  Normal effort.  Bilaterally Abd:  No distention.  Soft, nontender Other:     ED Results / Procedures / Treatments   Labs (all labs ordered are listed, but only abnormal results are displayed) Labs Reviewed  BASIC METABOLIC PANEL WITH  GFR - Abnormal; Notable for the following components:      Result Value   CO2 21 (*)    Anion gap 16 (*)    All other components within normal limits  CBC     EKG     RADIOLOGY     PROCEDURES:  Critical Care performed:   Procedures   MEDICATIONS ORDERED IN ED: Medications  cloNIDine  (CATAPRES ) tablet 0.1 mg (0.1 mg Oral Given 08/29/24 1218)     IMPRESSION / MDM / ASSESSMENT AND PLAN / ED COURSE  I reviewed the triage vital signs and the nursing notes. Patient's presentation is most consistent with exacerbation of chronic illness.  Patient presents with symptoms as above, overall well-appearing and feeling improved now, he is significantly hypertensive but on review of records it appears that he has not followed up for his blood pressure in the last 3 years  No concerning symptoms at this time.  Will give a dose of 0.1 clonidine , check  labs if reassuring and patient remains well-appearing with appropriate for discharge with outpatient PCP follow-up, will refill his medication which he reports he used the last amlodipine  dose last night.  Lab work is reassuring, appropriate for discharge with PCP referral medication refill      FINAL CLINICAL IMPRESSION(S) / ED DIAGNOSES   Final diagnoses:  Primary hypertension     Rx / DC Orders   ED Discharge Orders          Ordered    Ambulatory Referral to Primary Care (Establish Care)        08/29/24 1314    amLODipine  (NORVASC ) 5 MG tablet  Daily        08/29/24 1314    amLODipine  (NORVASC ) 5 MG tablet  Daily,   Status:  Discontinued        08/29/24 1314    amLODipine  (NORVASC ) 5 MG tablet  Daily        08/29/24 1324             Note:  This document was prepared using Dragon voice recognition software and may include unintentional dictation errors.   Arlander Charleston, MD 08/29/24 [phone]
